# Patient Record
Sex: Male | Born: 1970 | Race: White | Hispanic: No | Marital: Single | Smoking: Former smoker
Health system: Southern US, Community
[De-identification: ages and names within clinical notes are randomized; demographics above are authoritative.]

## PROBLEM LIST (undated history)

## (undated) HISTORY — PX: BACK SURGERY: SHX140

---

## 1999-05-02 ENCOUNTER — Encounter: Payer: Self-pay | Admitting: Emergency Medicine

## 1999-05-02 ENCOUNTER — Emergency Department (HOSPITAL_COMMUNITY): Admission: EM | Admit: 1999-05-02 | Discharge: 1999-05-02 | Payer: Self-pay | Admitting: Emergency Medicine

## 1999-11-04 ENCOUNTER — Encounter: Payer: Self-pay | Admitting: Emergency Medicine

## 1999-11-04 ENCOUNTER — Emergency Department (HOSPITAL_COMMUNITY): Admission: EM | Admit: 1999-11-04 | Discharge: 1999-11-04 | Payer: Self-pay | Admitting: Emergency Medicine

## 2000-10-27 ENCOUNTER — Encounter: Payer: Self-pay | Admitting: Emergency Medicine

## 2000-10-28 ENCOUNTER — Inpatient Hospital Stay (HOSPITAL_COMMUNITY): Admission: EM | Admit: 2000-10-28 | Discharge: 2000-10-29 | Payer: Self-pay | Admitting: Emergency Medicine

## 2001-01-19 ENCOUNTER — Ambulatory Visit (HOSPITAL_COMMUNITY): Admission: RE | Admit: 2001-01-19 | Discharge: 2001-01-19 | Payer: Self-pay | Admitting: Internal Medicine

## 2001-01-19 ENCOUNTER — Encounter: Payer: Self-pay | Admitting: Internal Medicine

## 2001-02-06 ENCOUNTER — Encounter: Admission: RE | Admit: 2001-02-06 | Discharge: 2001-05-07 | Payer: Self-pay

## 2001-07-22 ENCOUNTER — Emergency Department (HOSPITAL_COMMUNITY): Admission: EM | Admit: 2001-07-22 | Discharge: 2001-07-22 | Payer: Self-pay | Admitting: Internal Medicine

## 2001-09-11 ENCOUNTER — Emergency Department (HOSPITAL_COMMUNITY): Admission: EM | Admit: 2001-09-11 | Discharge: 2001-09-11 | Payer: Self-pay | Admitting: Emergency Medicine

## 2001-09-11 ENCOUNTER — Encounter: Payer: Self-pay | Admitting: Emergency Medicine

## 2003-01-27 ENCOUNTER — Emergency Department (HOSPITAL_COMMUNITY): Admission: EM | Admit: 2003-01-27 | Discharge: 2003-01-27 | Payer: Self-pay | Admitting: Emergency Medicine

## 2003-01-31 ENCOUNTER — Inpatient Hospital Stay (HOSPITAL_COMMUNITY): Admission: EM | Admit: 2003-01-31 | Discharge: 2003-02-01 | Payer: Self-pay | Admitting: Emergency Medicine

## 2003-06-18 ENCOUNTER — Emergency Department (HOSPITAL_COMMUNITY): Admission: EM | Admit: 2003-06-18 | Discharge: 2003-06-18 | Payer: Self-pay | Admitting: Emergency Medicine

## 2003-09-19 ENCOUNTER — Inpatient Hospital Stay (HOSPITAL_COMMUNITY): Admission: EM | Admit: 2003-09-19 | Discharge: 2003-09-21 | Payer: Self-pay | Admitting: Emergency Medicine

## 2003-09-26 ENCOUNTER — Ambulatory Visit (HOSPITAL_COMMUNITY): Admission: RE | Admit: 2003-09-26 | Discharge: 2003-09-26 | Payer: Self-pay | Admitting: Internal Medicine

## 2004-02-25 ENCOUNTER — Emergency Department (HOSPITAL_COMMUNITY): Admission: EM | Admit: 2004-02-25 | Discharge: 2004-02-25 | Payer: Self-pay | Admitting: Emergency Medicine

## 2004-10-31 ENCOUNTER — Ambulatory Visit: Payer: Self-pay | Admitting: Family Medicine

## 2004-11-21 ENCOUNTER — Ambulatory Visit: Payer: Self-pay | Admitting: Family Medicine

## 2005-01-31 ENCOUNTER — Ambulatory Visit: Payer: Self-pay | Admitting: Family Medicine

## 2005-02-06 ENCOUNTER — Ambulatory Visit (HOSPITAL_COMMUNITY): Admission: RE | Admit: 2005-02-06 | Discharge: 2005-02-06 | Payer: Self-pay | Admitting: Family Medicine

## 2005-03-08 ENCOUNTER — Emergency Department (HOSPITAL_COMMUNITY): Admission: EM | Admit: 2005-03-08 | Discharge: 2005-03-08 | Payer: Self-pay | Admitting: Emergency Medicine

## 2005-03-15 ENCOUNTER — Emergency Department (HOSPITAL_COMMUNITY): Admission: EM | Admit: 2005-03-15 | Discharge: 2005-03-15 | Payer: Self-pay | Admitting: Emergency Medicine

## 2005-04-05 ENCOUNTER — Observation Stay (HOSPITAL_COMMUNITY): Admission: EM | Admit: 2005-04-05 | Discharge: 2005-04-08 | Payer: Self-pay | Admitting: Emergency Medicine

## 2005-04-16 ENCOUNTER — Ambulatory Visit: Payer: Self-pay | Admitting: Family Medicine

## 2005-04-18 ENCOUNTER — Ambulatory Visit (HOSPITAL_COMMUNITY): Admission: RE | Admit: 2005-04-18 | Discharge: 2005-04-18 | Payer: Self-pay | Admitting: Family Medicine

## 2005-05-01 ENCOUNTER — Ambulatory Visit: Payer: Self-pay | Admitting: Internal Medicine

## 2005-06-13 ENCOUNTER — Ambulatory Visit: Payer: Self-pay | Admitting: Internal Medicine

## 2006-06-25 ENCOUNTER — Ambulatory Visit: Payer: Self-pay | Admitting: Family Medicine

## 2007-02-19 ENCOUNTER — Encounter
Admission: RE | Admit: 2007-02-19 | Discharge: 2007-05-20 | Payer: Self-pay | Admitting: Physical Medicine and Rehabilitation

## 2007-06-04 ENCOUNTER — Encounter: Payer: Self-pay | Admitting: Family Medicine

## 2007-08-11 DIAGNOSIS — R569 Unspecified convulsions: Secondary | ICD-10-CM

## 2007-08-11 DIAGNOSIS — F1920 Other psychoactive substance dependence, uncomplicated: Secondary | ICD-10-CM | POA: Insufficient documentation

## 2007-08-11 DIAGNOSIS — K219 Gastro-esophageal reflux disease without esophagitis: Secondary | ICD-10-CM | POA: Insufficient documentation

## 2007-11-24 ENCOUNTER — Emergency Department (HOSPITAL_COMMUNITY): Admission: EM | Admit: 2007-11-24 | Discharge: 2007-11-24 | Payer: Self-pay | Admitting: Emergency Medicine

## 2007-12-19 ENCOUNTER — Emergency Department (HOSPITAL_COMMUNITY): Admission: EM | Admit: 2007-12-19 | Discharge: 2007-12-19 | Payer: Self-pay | Admitting: Emergency Medicine

## 2008-01-07 ENCOUNTER — Emergency Department (HOSPITAL_COMMUNITY): Admission: EM | Admit: 2008-01-07 | Discharge: 2008-01-07 | Payer: Self-pay | Admitting: Emergency Medicine

## 2009-05-18 ENCOUNTER — Inpatient Hospital Stay (HOSPITAL_COMMUNITY): Admission: EM | Admit: 2009-05-18 | Discharge: 2009-05-19 | Payer: Self-pay | Admitting: Emergency Medicine

## 2010-07-03 NOTE — Letter (Signed)
Summary: rpc chart  rpc chart   Imported By: Curtis Sites 01/11/2010 13:31:21  _____________________________________________________________________  External Attachment:    Type:   Image     Comment:   External Document

## 2010-09-03 LAB — BASIC METABOLIC PANEL
BUN: 12 mg/dL (ref 6–23)
BUN: 9 mg/dL (ref 6–23)
CO2: 27 mEq/L (ref 19–32)
CO2: 28 mEq/L (ref 19–32)
Calcium: 8.5 mg/dL (ref 8.4–10.5)
Calcium: 8.6 mg/dL (ref 8.4–10.5)
Chloride: 104 mEq/L (ref 96–112)
Chloride: 110 mEq/L (ref 96–112)
Creatinine, Ser: 0.99 mg/dL (ref 0.4–1.5)
Creatinine, Ser: 1.17 mg/dL (ref 0.4–1.5)
GFR calc Af Amer: >60 mL/min (ref >60)
GFR calc Af Amer: >60 mL/min (ref >60)
GFR calc non Af Amer: >60 mL/min (ref >60)
GFR calc non Af Amer: >60 mL/min (ref >60)
Glucose, Bld: 102 mg/dL — ABNORMAL HIGH (ref 70–99)
Glucose, Bld: 92 mg/dL (ref 70–99)
Potassium: 3.9 mEq/L (ref 3.5–5.1)
Potassium: 4 mEq/L (ref 3.5–5.1)
Sodium: 140 mEq/L (ref 135–145)
Sodium: 142 mEq/L (ref 135–145)

## 2010-09-03 LAB — URINALYSIS, ROUTINE W REFLEX MICROSCOPIC
Bilirubin Urine: NEGATIVE
Glucose, UA: NEGATIVE mg/dL
Leukocytes, UA: NEGATIVE
Nitrite: NEGATIVE
Protein, ur: 100 mg/dL — AB
Specific Gravity, Urine: 1.01 (ref 1.005–1.030)
Urobilinogen, UA: 0.2 mg/dL (ref 0.0–1.0)
pH: >9 — ABNORMAL HIGH (ref 5.0–8.0)

## 2010-09-03 LAB — URINE MICROSCOPIC-ADD ON
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
## 2010-10-19 NOTE — Discharge Summary (Signed)
Alexander Liu, Alexander Liu                           ACCOUNT NO.:  [id number]   MEDICAL RECORD NO.:  [id number]                   PATIENT TYPE:  INP   LOCATION:  A217                                 FACILITY:  [facility]   PHYSICIAN:  Hanley Hays. Dechurch, M.D.           DATE OF BIRTH:  May 23, 1971   DATE OF ADMISSION:  09/19/2003  DATE OF DISCHARGE:  09/21/2003                                 DISCHARGE SUMMARY   DIAGNOSES:  1. Seizure disorder.  2. History of OxyContin addiction, now on methadone maintenance.  3. Constipation.  4. Reflux.   DISPOSITION:  The patient is discharged to home.  Follow up with Dr.  Gerilyn Pilgrim in two weeks.  Outpatient MRI prior to follow up.  Follow up with  Dr.  Lodema Hong as schedule.   HOSPITAL MEDICATIONS:  1. Trileptal 300 mg b.i.d. for one week then increase to 600 b.i.d.  2. MiraLax 17 g x2 daily.  May increase or decrease as needed.  3. Valium 10 mg b.i.d. p.r.n. spasm and anxiety.  4. Methadone maintenance, currently on 75 mg daily.   CONDITION ON DISCHARGE:  Improved.   HOSPITAL COURSE:  A 40 year old Caucasian male who has been on methadone  maintenance since September 2004 after being treated for OxyContin  addiction.  He has actually been doing reasonably well.  Today, on the day  of discharge, the patient had what was described as a generalized tonic  clonic seizure.  He was seen in consultation by Dr. Gerilyn Pilgrim.  EEG revealed  abnormal discharges revealed left temporal epileptiform discharges.  It was  felt that he should be treated with antiepileptic medications, though he did  not officially fit the criteria as he only had one seizure.  The patient  tolerated his initial dose of Trileptal without difficulty.  This  hospitalization was unremarkable.  He did have problems with constipation related to his chronic narcotic use.  Bowel regimen and dietary consultation was obtained.  He was stable at the  time of discharge and discharged to home in stable  condition with follow up  as noted above.     ___________________________________________                                         Hanley Hays. Josefine Class, M.D.   FED/MEDQ  D:  09/21/2003  T:  09/22/2003  Job:  [id number]   cc:   Milus Mallick. Lodema Hong, M.D.  [address]  [place], [place] [address]  Fax: [phone]   Kofi A. Gerilyn Pilgrim, M.D.  [address] [place]  [place]  [place] [address]  Fax: [phone]

## 2010-10-19 NOTE — Discharge Summary (Signed)
[name], [name]                           ACCOUNT NO.:  [id number]   MEDICAL RECORD NO.:  [id number]                   PATIENT TYPE:  INP   LOCATION:  3020                                 FACILITY:  [facility]   PHYSICIAN:  C. Ulyess Mort, M.D.             DATE OF BIRTH:  05/12/1971   DATE OF ADMISSION:  01/30/2003  DATE OF DISCHARGE:  02/01/2003                                 DISCHARGE SUMMARY   DISCHARGE DIAGNOSIS:  Opioid withdrawal.   DISCHARGE MEDICATIONS:  None.   FOLLOWUP:  The patient is to meet with [facility] on February 02, 2003 at 7 a.m. to become enrolled in their methadone clinic.   HISTORY AND PHYSICAL:  The patient is a 40 year old white male who presents  after quitting OxyContin three days ago.  He reports he was taking six to  seven 80 mg pills of OxyContin for many years.  He states he would rub off  the extended-release codeine, crush the medication and snort it through his  nose.  [facility] and stopped  his drug abuse cold.  He began with symptoms of a withdrawal on the morning  of January 30, 2003.  He reports shivers, nausea, vomiting, _________  erection and diarrhea.  It appears a doctor is not present on the weekends  at [facility] and the patient was referred to the [facility].  On  admission, he was asking for methadone and stating that he would like to  join the methadone program, as he cannot handle the symptoms that are  occurring from his withdrawal.   ALLERGIES:  No known drug allergies.   SOCIAL HISTORY:  He denies tobacco, alcohol and illicit drugs, other than  oxycodone.  He is divorced and currently unemployed.  He has one son who is  39 years old who resides with him; his son is currently staying with his  great aunt.   PHYSICAL EXAMINATION:  VITAL SIGNS:  Temperature 99.2, blood pressure  110/69, pulse 61, respirations of 19, O2 saturation 98% on room air.  GENERAL:   Shaking, anxious, vomiting.  EYES:  Mild dilation of pupils.  RESPIRATORY:  Clear to auscultation bilaterally.  CARDIOVASCULAR:  Regular rate and rhythm with no murmurs, rubs, or gallops.  GI:  Soft, nondistended, diffuse tenderness.  EXTREMITIES:  No cyanosis, clubbing or edema.  NEUROLOGICAL:  No focal deficits.   ADMISSION LABORATORIES:  WBC 6.7, hemoglobin 14.2, hematocrit 41.5,  platelets 306,000.  Sodium 141, potassium 3.2, chloride 109, bicarb 23, BUN  14, creatinine 1.2, glucose 137.  Alcohol level less than 5.  Urinary drug  screen:  Positive benzodiazepines, positive opiates and positive cannabis.   HOSPITAL COURSE:  PROBLEM #1 - OPIATE WITHDRAWAL:  The patient's symptoms  were managed medically.  Zofran and Compazine were used for nausea,  clonidine patch was placed for  increasing sympathetic symptoms, trazodone  and Ativan were used for anxiety relief and bismuth for diarrhea.  The  patient received one 40 mg dose of methadone on January 31, 2003; he received  another 40 mg dose of methadone on February 01, 2003 prior to discharge.  The  patient is to follow up with the methadone clinic tomorrow morning at 7 a.m.  to enroll in their program.  The patient has also been informed about  Narcotics Anonymous.   PROBLEM #2 - HYPOKALEMIA:  The patient's hypokalemia was likely secondary to  his extensive vomiting.  Potassium was repleted.      Alexander Harness, MD                    Alexander Liu, M.D.    Alexander Liu  D:  02/01/2003  T:  02/02/2003  Job:  [id number]

## 2010-10-19 NOTE — Procedures (Signed)
[facility]  Patient:    Alexander Liu, Alexander Liu Visit Number: [id number] MRN: [id number]          Service Type: PMG Location: [facility] Attending Physician:  Sondra Come Dictated by:   Dr. Resa Miner. Date: 02/12/01 Admit Date:  02/06/2001   CC:         Alexander Liu, M.D. at [facility] [address] [place], [place] [address]             [address]   Procedure Report  HISTORY OF PRESENT ILLNESS:  Alexander Liu returns to clinic today after his initial evaluation on 02/09/01, for a lumbar epidural steroid injection.  Alexander Liu is status post L5-S1 diskectomy in October 2001.  He has a L4-5 herniated disk, and complains of bilateral lower extremity radicular symptoms. Health and history form and 14 point review of systems was reviewed.  There was no significant change per patient since his initial visit three days ago. The patient continues to take OxyContin 40 mg two per day, and Oxy IR 5 mg for breakthrough pain.  PHYSICAL EXAMINATION:  Unchanged from three days ago.  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  PLAN:  Lumbar epidural steroid injection.  The procedure was explained to the patient, and informed consent was obtained.  DESCRIPTION OF PROCEDURE:  The patient was brought back to the fluoroscopy suite and placed on the table in prone position.  Skin was prepped in usual sterile fashion.  Skin and subcutaneous tissue was anesthetized with 3 cc of MPF or preservative free 1% lidocaine.  Under direct fluoroscopic guidance, an 18 gauge 3.5 inch Tuohy needle was advanced into the paramedian at L5-S1 epidural space with loss of resistance technique.  No CSF, heme, or paresthesia was noted.  This was injected with 1 cc of preservative free Depo-Medrol 80 mg/cc plus 1 cc  of normal saline without complication.  The patient tolerated the procedure well.  Discharge instructions given.  FOLLOWUP:  The patient is to return to clinic in one week for re-evaluation and possible second lumbar epidural steroid injection.  DISCHARGE MEDICATIONS:  Continue current medications.  A long-term goal would be to wean from narcotic analgesia.  Await appointment with Dr. Bernette Redbird, spine surgeon, for possible surgical intervention.  The patient was educated on the above findings and recommendations, and understands.  There were no barriers to communication. Dictated by:   Dr. Andrey Campanile Attending Physician:  Sondra Come DD:  02/12/01 TD:  02/12/01 Job: [phone] [id number]

## 2010-10-19 NOTE — H&P (Signed)
NAMEMAHARI, STRAHM NO.:  [id number]   MEDICAL RECORD NO.:  [id number]          PATIENT TYPE:  EMS   LOCATION:  ED                            FACILITY:  [facility]   PHYSICIAN:  Osvaldo Shipper, MD     DATE OF BIRTH:  12/10/1970   DATE OF ADMISSION:  04/05/2005  DATE OF DISCHARGE:  LH                                HISTORY & PHYSICAL   PRIMARY CARE DOCTOR:  Dr. Syliva Overman.   NEUROLOGIST:  Dr. Gerilyn Pilgrim.   PSYCHIATRIST:  Dr. Redmond Baseman.   ADMITTING DIAGNOSES:  1.  Acute gastritis, likely viral.  2.  History of seizure disorder.  3.  History of gastroesophageal reflux disease.  4.  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   CHIEF COMPLAINT:  Nausea and vomiting for the past two days.   HISTORY OF PRESENT ILLNESS:  The patient is a 40 year old white male with  the past history of seizure disorder, GERD, and history of drug dependency  on treatment who was well until yesterday morning when he started  experiencing nausea followed by emesis.  He also gave history of fever which  started along with his other symptoms.  The patient does not give history of  any abdominal pain, but he does mention that he experienced some burning  sensation in his upper abdomen.  The patient denied eating out anywhere  recently.  He denies any sick contact.  He only mentions that he has a new  kitten which he acquired about a month and a half ago.   The emesis initially was of food then clear followed by bilious and dark  brown in appearance.  At no point did he have any fresh blood in it.   The patient never experienced any abdominal pain.  He just experienced some  burning today because of his persistent vomiting and nausea.  There is  history of fever.  He never recorded his temperature at home; however, he  said his temperature here has been as high as 103.  He did experience some  chills at home.  He did have two bowel movements yesterday which were  not  loose, and he had one formed bowel movement this morning which was brownish  in color with no blood.  Otherwise, he does not give any history of  diarrhea.   The patient denies any urinary discomfort or any other symptoms at this  time.   MEDICATIONS AT HOME:  1.  Methadone 40 mg t.i.d.  2.  Xanax 1 mg t.i.d.  3.  Prevacid unknown dose twice daily.  4.  Trileptal 600 mg b.i.d.   No over the counter drug use.   ALLERGIES:  No known drug allergies.   PAST MEDICAL HISTORY:  1.  Significant for GERD diagnosed three to four years ago.  2.  Seizure disorders diagnosed two years ago.  No seizure for more than a      year currently.  3.  History of anxiety disorder.  4.  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  5.  History of back surgery x 2 in the past.  6.  There is no history of EGD or colonoscopy in this patient.   SOCIAL HISTORY:  The patient lives in [place] with his 40-year-old son and  a great aunt.  He is a single parent.  He is currently unemployed.  Quit  smoking seven to eight years ago.  No alcohol intake whatsoever.  He does  smoke marijuana occasionally.  He used to do cocaine in the past.  Nothing  recently.  No history of any IV drug use.   FAMILY HISTORY:  Mother has diabetes and acid reflux disease.  One of his  brothers has panic disorder.  Family history also positive for bone cancer  and another unknown cancer.  History of heart disease in a grandmother.   REVIEW OF SYSTEMS:  A 10-point review of systems was done which was just  remarkable for patient feeling weak and with slight dizziness currently.   PHYSICAL EXAMINATION:  VITAL SIGNS:  On presentation, his temperature was  102, blood pressure 107/61, heart rate 105, respiratory rate 16, saturating  99% on room air.  Temperature went as high as 103.4.  Last recorded  temperature is 100.3.  GENERAL:  This is a well-developed, well-nourished young male in slight  discomfort  because of his nausea and vomiting.  HEENT:  There is no pallor, no icterus.  Oral mucosa is slightly on the dry  side.  No oral lesions are seen.  NECK:  Soft and supple.  No thyromegaly is appreciated.  CARDIOVASCULAR:  S1 and S2 are normal.  Regular.  No murmurs appreciated.  LUNGS:  Clear to auscultation bilaterally.  ABDOMEN:  Remarkably soft, nontender, and nondistended.  Bowel sounds are  sluggish.  No mass or organomegaly is present.  EXTREMITIES:  Without edema.  No cyanosis is present.  Scratch marks are  noted on both hands which is related to his pet.  PSYCHIATRIC:  The patient is alert and oriented x 3.   LABORATORY DATA:  His white count is 11.2.  Full differential is not  available at this time.  Hemoglobin is 14.1, platelet count 165.   Sodium 128, potassium 3.6, chloride 91, bicarb 23, glucose 140, BUN 14,  creatinine 1.1, total bilirubin 0.6, alk phos 97, AST is 89, ALT 123,  albumin 3.8, calcium 8.6.   Lipase normal at 14.   UA was just positive for some protein.  Otherwise, unremarkable.   IMAGING STUDIES:  The patient had a CAT scan of his abdomen and pelvis which  showed a thickened terminal ileum with no signs for inflammation.  Otherwise, no other abnormality has been reported by the radiologist at this  time.   IMPRESSION:  This is a 40 year old white male with history of seizure  disorder, drug dependency and GERD who presents with nausea and vomiting for  the past two days.  He is also febrile with temperatures up to 103.  The  patient appears to possibly have acute gastritis and possibly a viral  syndrome which could be causing all of his symptoms at this time.  The  patient does have elevated LFTs which could be related to Trileptal as this  medication can cause elevated transaminases.  Hepatitis as the cause for his  current symptoms is very unlikely.  He does not have any other focal symptoms to further characterize his fever.   PLAN:  1.  We will  observe the patient in the hospital and give him  IV fluids and      antiemetics and also give him PPI through the IV route.  With the      conservative management, his symptoms improve.  We will keep him n.p.o.      except medication at this time; and once his symptoms improve, we can      slowly start feeding him.   1.  Regarding his elevated transaminases, probably related to his Trileptal.      However, we will check hepatitis profile in this patient.  CT of the      abdomen and pelvis did not report any abnormality in the liver as such.   1.  Fever likely related to the viral syndrome.  We will, however, check      blood cultures and do a chest x-ray if the patient continues to have      fever.  At this time, I am refraining from starting any antibiotics as      we do not have any clear indication for the source of fever at this      time.   1.  For his seizure disorder, will continue his Trileptal at this time.  We      will also continue his methadone and Xanax.  Will check a urine drug      screen.   1.  Regarding the thickened terminal ileum, since there is no inflammation      in that area, it is probably just an incidental finding which is also      consistent with a very benign physical examination.  He might require      outpatient referral to gastroenterology.   Further management decision will be based on results of initial testing and  the patient's response to treatment.      Osvaldo Shipper, MD  Electronically Signed     GK/MEDQ  D:  04/05/2005  T:  04/05/2005  Job:  [id number]   cc:   Milus Mallick. Lodema Hong, M.D.  Fax: [phone]

## 2010-10-19 NOTE — Discharge Summary (Signed)
NAMEMARSHON, Alexander NO.:  [id number]   MEDICAL RECORD NO.:  [id number]          PATIENT TYPE:  OBV   LOCATION:  A325                          FACILITY:  [facility]   PHYSICIAN:  Osvaldo Shipper, MD     DATE OF BIRTH:  1970/10/04   DATE OF ADMISSION:  04/05/2005  DATE OF DISCHARGE:  11/06/2006LH                                 DISCHARGE SUMMARY   DISCHARGE DIAGNOSES:  1.  Acute viral gastritis, currently resolved.  2.  Thrombocytopenia, stable, likely secondary to antiseizure medication.  3.  Seizure disorder, stable.  4.  Drug dependency, on methadone maintenance.   Please review the H&P dictated at the time of admission for details  regarding the patient's presenting illness.   BRIEF HOSPITAL COURSE:  1.  Acute viral gastritis.  This is a 40 year old young Caucasian male with      a past medical history of GERD, seizure disorder and drug dependency on      methadone maintenance, who presented to the ED with a two-day history of      severe nausea, vomiting, acid reflux and high fevers.  The patient was      thought to have acute gastritis probably related to a viral syndrome.      He was started on IV fluids and given PPI through the IV route and given      Tylenol for fever.  Blood cultures, urine cultures and chest x-ray were      done to evaluate the fever further, but no focal source of infection was      found.  The patient improved subsequently over the following two days.      He is currently not having high temperatures.  His last fever was last      night, which was about 100.4.  Subsequently, he has been afebrile.      Symptomatically, he has improved significantly, and he is able to      tolerate p.o. intake with no difficulties whatsoever.  Considering all      of the above, the patient is considered stable for discharge.  The      patient did have a CAT scan of his abdomen and pelvis done by the ED at      the time of presentation, and he was found to  have a possible thickened      ileal wall, but no active inflammation was noted.  The patient's abdomen      is completely benign; however, I believe he might benefit from an      outpatient GI referral.   1.  Elevated LFTs.  The patient was also found to have elevated AST and ALT.      His alk phos and his bilirubin were normal.  Trileptal is known to cause      elevated liver enzymes as well.  His enzyme levels did come down, though      not completely to normal limits.  We did send a hepatitis profile which      is pending at this time, which  will need to be followed up.  I am going      to have a repeat LFT check this Friday to ensure that his levels are      stable or have returned to normal.   1.  Thrombocytopenia.  The patient's presentation platelet count was about      165.  Subsequent levels were running in the 110s-120s range.  Again,      there is no clear-cut reason for his thrombocytopenia apart from the      Trileptal.  The patient does not have any overt bleeding.  He does not      consume alcohol.  A hepatitis profile, as mentioned above, is pending at      this time.  Once again, we will recheck his platelet count this Friday.      If the patient's counts are dropping or are still staying low, he might      benefit from referral to a hematologist.   1.  His other medical problems, including seizure disorder and drug      dependency, remained stable.   On the day of discharge, his vital signs remain stable.  The patient is  asymptomatic, and he is considered okay for DC.   DISCHARGE MEDICATIONS:  1.  Prevacid 30 mg p.o. twice daily.  2.  Tylenol 650 mg p.r.n. for fever.   Otherwise, the patient may resume all of his other outpatient medications as  before, which include methadone, Xanax and Trileptal.   FOLLOWUP:  1.  With Dr. Syliva Overman in one week.  2.  CBC and a CMP to be done on November 10th to follow up on      thrombocytopenia and elevated liver  enzymes.  3.  Imaging studies:  CT of the abdomen and pelvis as discussed above.  4.  Other followup includes outpatient consultation with Dr. Karilyn Cota or Dr.      Jena Gauss for a thickened ileal wall seen on CAT scan.      Osvaldo Shipper, MD  Electronically Signed     GK/MEDQ  D:  04/08/2005  T:  04/08/2005  Job:  [id number]   cc:   Milus Mallick. Lodema Hong, M.D.  Fax: [phone]   Lionel December, M.D.  [address]  [place]  [place] [address]

## 2010-10-19 NOTE — Procedures (Signed)
NAME:  BREVEN, GUIDROZ                           ACCOUNT NO.:  [id number]   MEDICAL RECORD NO.:  [id number]                   PATIENT TYPE:  INP   LOCATION:  A217                                 FACILITY:  [facility]   PHYSICIAN:  Kofi A. Gerilyn Pilgrim, M.D.              DATE OF BIRTH:  Apr 03, 1971   DATE OF PROCEDURE:  DATE OF DISCHARGE:                                EEG INTERPRETATION   The patient is a 40 year old who has new onset seizures.   ANALYSIS:  This 16-channel recording is conducted for approximately 20  minutes. The quality of the recording is acceptable, although there are some  high amplitude sway/sweat activity noted. There is also continuous lead  popping noted in the F4 lead. In any case, there is a __________ 12 to 11  hertz that is well formed which attenuates with eye opening. There is beta  activity seen in the frontal areas. Awake and drowsy activities are seen.  Photic stimulation and hyperventilation does not elicit any abnormal  responses. There is rare sharp wave activity seen in the left temporal  region which phase reverses at T3 in which has a field.  Two to three of  these discharges are seen.   IMPRESSION:  This is an abnormal recording of the awake and drowsy state due  to rare epileptiform discharges seen in the left temporal lobe.      ___________________________________________                                            Darleen Crocker A. Gerilyn Pilgrim, M.D.   KAD/MEDQ  D:  09/20/2003  T:  09/20/2003  Job:  [id number]

## 2010-10-19 NOTE — Consult Note (Signed)
[facility]  Patient:    Alexander Liu, Alexander Liu Visit Number: [id number] MRN: [id number]          Service Type: PMG Location: [facility] Attending Physician:  Sondra Come Dictated by:   Sondra Come, D.O. Proc. Date: 02/09/01 Admit Date:  02/06/2001   CC:         Angelique Blonder _________, M.D.   Consultation Report  REFERRING Garnet Chatmon:  Dr. Angelique Blonder ______ , [facility], [address], [place], [place]  [address]  CHIEF COMPLAINT:  Low back pain with lower extremity pain.  HISTORY OF PRESENT ILLNESS:  Alexander Liu is a 40 year old right-hand-dominant male who was kindly referred by Dr. ______.  Patient presents accompanied by his mother.  Patient states that he has low back pain radiating into his lower extremities with associated numbness in his right buttock, left anterior thigh, anterior leg and great toe.  He is status post L5-S1 diskectomy, October of 2001, by Dr. Winn Jock. [place].  Postoperatively, patient was supposed to have physical therapy, however, he states that his wife left him to care for their 18-month-old son and patient could not find the time to attend therapy.  Nevertheless, patient states that he does not believe he had adequate healing and continues to have back pain.  His back pain has worsened significantly and a repeat MRI of the lumbar spine was done revealing a recurrent L5-S1 disk herniation central to left, paracentrally.  There is also a small L4-5 disk protrusion.  Dr. ______ has referred Mr. Taher back to Dr. Gerrit Heck.  He has an appointment September 17th or 19th.  Patient has not had any interventional spinal injections recently.  He states that he had a lumbar epidural steroid injection prior to his surgery which gave him some mild relief.  He has primarily been treated with pain medication including OxyContin 40 mg b.i.d., but has required three-times-a-day dosing over the past few days.  Patient  states that he has approximately 22 days left on his current prescription.  His pain is a 8/10 on a subjective scale and characterized as constant, sharp, burning and stabbing, made worse with bending, sitting and working, improved with medications.  Sleep is poor secondary to pain.  Function and quality-of-life indices have declined. Patient is not currently working.  He was previously working in [employer]. Health and history form and 14-point review of systems were reviewed.  Patient denies bowel and bladder dysfunction.  He admits to constipation and heartburn as well as ringing and buzzing in the ears, excessive worry and depression and weakness in his left lower extremity.  PAST MEDICAL HISTORY:  Denies.  PAST SURGICAL HISTORY:  L5-S1 diskectomy, October 2001.  FAMILY HISTORY:  Heart disease, lung disease, cancer, diabetes and high blood pressure.  SOCIAL HISTORY:  Denies smoking.  Occasional alcohol use.  He is currently separated from his wife.  He is not working for the past several months.  ALLERGIES:  No known drug allergies.  MEDICATIONS: 1. Paxil 20 mg daily. 2. OxyContin 40 mg b.i.d. 3. Valium 5 mg daily as needed, patient is rarely taking this now. 4. Vioxx as needed without any relief.  PHYSICAL EXAMINATION:  GENERAL:  Physical examination reveals a healthy male in no acute distress. Mood is depressed, affect is flat.  VITAL SIGNS:  Blood pressure 137/69, pulse 89, respirations 16, pulse oximetry 95%.  NEUROMUSCULAR:  Examination of the patients back reveals a midline scar in the lumbosacral region.  There is significant tenderness to palpation in bilateral lumbar paraspinals with exaggerated response.  Range of motion reveals limited flexion and extension secondary to pain.  Manual muscle testing is 5/5, bilateral lower extremities, with the exception of 4/5, left hip flexor and left ankle dorsiflexors.  Sensory exam reveals decreased light touch to the  left anterior thigh, and diffusely in the left leg and foot. Muscle stretch reflexes are 2+/4, bilateral patellar and medial hamstrings, 0/4, bilateral Achilles.  Straight leg raise is positive on the right at 50 degrees.  FABER test is negative bilaterally.  There are tight hamstring and hip flexor muscles noted bilaterally.  PERIPHERAL VASCULAR:  Pulses are equal distally, bilateral lower extremities, without heat, erythema or edema.  IMAGING STUDY:  MRI report dated January 18, 2001 reveals a large recurrent central-and-left paracentral disk herniation at L5-S1 with mass effect on the thecal sac and on the left S1 nerve root.  There is also a shallow disk protrusion at L4-5.  IMPRESSION: 1. 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  Patient seems to have mild    weakness in the left L4 distribution on the left with L4 distribution    radicular symptoms.  His exam was also noteworthy for positive straight leg    raise which would be consistent with S1 irritation. 2. Depressive disorder.  PLAN: 1. Lumbar epidural steroid injection.  Procedure was discussed at length with    patient, who will be brought back to clinic for a trial of lumbar epidural    steroid injections with goal of decreasing pain and decreasing need for    narcotic analgesia. 2. Prescription for OxyIR 5 mg 1 p.o. q.4h. p.r.n. breakthrough pain,    #60 without refills. 3. Physical therapy consult for range of motion, lumbar stabilization    exercises in a static-to-dynamic fashion with lower extremity stretching    and body mechanics as well as a TENS trial, two to three times per week    x 4 weeks. 4. Await followup with spine surgeon, Dr. Gerrit Heck.  Patient was educated in the above findings and recommendations and understands.  There were no barriers to communication. Dictated by:   Sondra Come, D.O. Attending Physician:  Sondra Come  DD:  02/09/01 TD:  02/10/01 Job: [id number]

## 2011-02-28 LAB — POCT I-STAT, CHEM 8
BUN: 11
Calcium, Ion: 1.16
Chloride: 102
Creatinine, Ser: 1.1
Glucose, Bld: 94
HCT: 39
Hemoglobin: 13.3
Potassium: 3.6
Sodium: 139
TCO2: 26

## 2011-03-01 LAB — URINALYSIS, ROUTINE W REFLEX MICROSCOPIC
Bilirubin Urine: NEGATIVE
Glucose, UA: NEGATIVE
Ketones, ur: NEGATIVE
Nitrite: NEGATIVE
Specific Gravity, Urine: >1.03 — ABNORMAL HIGH
Urobilinogen, UA: 0.2
pH: 5.5

## 2011-03-01 LAB — URINE MICROSCOPIC-ADD ON

## 2011-03-01 LAB — URINE CULTURE: Colony Count: 3000

## 2012-07-29 ENCOUNTER — Encounter (HOSPITAL_COMMUNITY): Payer: Self-pay | Admitting: Emergency Medicine

## 2012-07-29 ENCOUNTER — Emergency Department (HOSPITAL_COMMUNITY)
Admission: EM | Admit: 2012-07-29 | Discharge: 2012-07-29 | Disposition: A | Discharge status: Home / Self Care | Payer: Medicaid Other | Attending: Emergency Medicine | Admitting: Emergency Medicine

## 2012-07-29 DIAGNOSIS — H9209 Otalgia, unspecified ear: Secondary | ICD-10-CM | POA: Insufficient documentation

## 2012-07-29 DIAGNOSIS — R42 Dizziness and giddiness: Secondary | ICD-10-CM | POA: Insufficient documentation

## 2012-07-29 DIAGNOSIS — R12 Heartburn: Secondary | ICD-10-CM | POA: Insufficient documentation

## 2012-07-29 DIAGNOSIS — R450 Nervousness: Secondary | ICD-10-CM | POA: Insufficient documentation

## 2012-07-29 DIAGNOSIS — R52 Pain, unspecified: Secondary | ICD-10-CM | POA: Insufficient documentation

## 2012-07-29 DIAGNOSIS — R630 Anorexia: Secondary | ICD-10-CM | POA: Insufficient documentation

## 2012-07-29 DIAGNOSIS — Z79899 Other long term (current) drug therapy: Secondary | ICD-10-CM | POA: Insufficient documentation

## 2012-07-29 DIAGNOSIS — R509 Fever, unspecified: Secondary | ICD-10-CM | POA: Insufficient documentation

## 2012-07-29 DIAGNOSIS — Z87891 Personal history of nicotine dependence: Secondary | ICD-10-CM | POA: Insufficient documentation

## 2012-07-29 DIAGNOSIS — R51 Headache: Secondary | ICD-10-CM | POA: Insufficient documentation

## 2012-07-29 DIAGNOSIS — R112 Nausea with vomiting, unspecified: Secondary | ICD-10-CM | POA: Insufficient documentation

## 2012-07-29 DIAGNOSIS — J020 Streptococcal pharyngitis: Secondary | ICD-10-CM | POA: Insufficient documentation

## 2012-07-29 DIAGNOSIS — F411 Generalized anxiety disorder: Secondary | ICD-10-CM | POA: Insufficient documentation

## 2012-07-29 LAB — COMPREHENSIVE METABOLIC PANEL WITH GFR
ALT: 88 U/L — ABNORMAL HIGH (ref 0–53)
AST: 56 U/L — ABNORMAL HIGH (ref 0–37)
Albumin: 4 g/dL (ref 3.5–5.2)
Alkaline Phosphatase: 127 U/L — ABNORMAL HIGH (ref 39–117)
BUN: 12 mg/dL (ref 6–23)
CO2: 28 meq/L (ref 19–32)
Calcium: 10 mg/dL (ref 8.4–10.5)
Chloride: 97 meq/L (ref 96–112)
Creatinine, Ser: 0.92 mg/dL (ref 0.50–1.35)
GFR calc Af Amer: >90 mL/min
GFR calc non Af Amer: >90 mL/min
Glucose, Bld: 144 mg/dL — ABNORMAL HIGH (ref 70–99)
Potassium: 3.6 meq/L (ref 3.5–5.1)
Sodium: 139 meq/L (ref 135–145)
Total Bilirubin: 0.3 mg/dL (ref 0.3–1.2)
Total Protein: 8.7 g/dL — ABNORMAL HIGH (ref 6.0–8.3)

## 2012-07-29 LAB — CBC WITH DIFFERENTIAL/PLATELET
Basophils Absolute: 0 10*3/uL (ref 0.0–0.1)
Basophils Relative: 0 % (ref 0–1)
Lymphocytes Relative: 4 % — ABNORMAL LOW (ref 12–46)
MCHC: 34.7 g/dL (ref 30.0–36.0)
Neutro Abs: 13.7 10*3/uL — ABNORMAL HIGH (ref 1.7–7.7)
Platelets: 228 10*3/uL (ref 150–400)
RDW: 13.1 % (ref 11.5–15.5)
WBC: 15 10*3/uL — ABNORMAL HIGH (ref 4.0–10.5)

## 2012-07-29 LAB — RAPID STREP SCREEN (MED CTR MEBANE ONLY): Streptococcus, Group A Screen (Direct): POSITIVE — AB

## 2012-07-29 MED ORDER — AMOXICILLIN 250 MG PO CAPS: 250.0000 mg | ORAL_CAPSULE | Freq: Three times a day (TID) | ORAL | Status: DC

## 2012-07-29 MED ORDER — DEXTROSE 5 % IV SOLN
1.0000 g | Freq: Once | INTRAVENOUS | Status: AC
  Administered 2012-07-29: 1 g via INTRAVENOUS
  Filled 2012-07-29: qty 10

## 2012-07-29 MED ORDER — SODIUM CHLORIDE 0.9 % IV SOLN
Freq: Once | INTRAVENOUS | Status: AC
  Administered 2012-07-29: 19:00:00 via INTRAVENOUS

## 2012-07-29 MED ORDER — RANITIDINE HCL 150 MG PO TABS: 150.0000 mg | ORAL_TABLET | Freq: Two times a day (BID) | ORAL | Status: DC

## 2012-07-29 MED ORDER — FAMOTIDINE IN NACL 20-0.9 MG/50ML-% IV SOLN
20.0000 mg | Freq: Once | INTRAVENOUS | Status: AC
  Administered 2012-07-29: 20 mg via INTRAVENOUS
  Filled 2012-07-29: qty 50

## 2012-07-29 MED ORDER — PROMETHAZINE HCL 12.5 MG PO TABS: 12.5000 mg | ORAL_TABLET | Freq: Four times a day (QID) | ORAL | Status: DC | PRN

## 2012-07-29 MED ORDER — ONDANSETRON HCL 4 MG/2ML IJ SOLN
4.0000 mg | Freq: Once | INTRAMUSCULAR | Status: AC
  Administered 2012-07-29: 4 mg via INTRAVENOUS
  Filled 2012-07-29: qty 2

## 2012-07-29 MED ORDER — PROMETHAZINE HCL 25 MG/ML IJ SOLN
12.5000 mg | Freq: Once | INTRAMUSCULAR | Status: AC
  Administered 2012-07-29: 12.5 mg via INTRAVENOUS
  Filled 2012-07-29: qty 1

## 2012-07-29 NOTE — ED Notes (Signed)
Pt c/o fever, cough, and vomiting since last night. Pt denies any blood in vomit. Pt also reports sore throat.

## 2012-07-29 NOTE — ED Provider Notes (Signed)
History     CSN: [id number]  Arrival date & time 07/29/12  1824   None     Chief Complaint  Patient presents with  . Sore Throat  . Emesis    (Consider location/radiation/quality/duration/timing/severity/associated sxs/prior Treatment)  HPI Alexander Liu is a 42 y.o. male who presents to the ED with sore throat. The sore throat started yesterday. He describes the sore throat as burning that hurts with swallowing. Rates his pain as 7/10.   Associated symptoms include vomiting, headache, ear pain, aching all over, heart burn. Temp 101 last night. The history was provided by the patient.  History reviewed. No pertinent past medical history.  Past Surgical History  Procedure Laterality Date  . Back surgery      History reviewed. No pertinent family history.  History  Substance Use Topics  . Smoking status: Former Games developer  . Smokeless tobacco: Not on file  . Alcohol Use: No      Review of Systems  Constitutional: Positive for fever, chills and appetite change. Negative for diaphoresis and fatigue.  HENT: Positive for sore throat. Negative for ear pain, congestion, facial swelling, neck pain, neck stiffness, dental problem, voice change and sinus pressure.   Eyes: Negative for photophobia, pain and discharge.  Respiratory: Negative for cough, chest tightness and wheezing.   Cardiovascular: Negative for chest pain and palpitations.  Gastrointestinal: Positive for nausea and vomiting. Negative for abdominal pain, diarrhea, constipation and abdominal distention.  Genitourinary: Negative for dysuria, frequency, flank pain and difficulty urinating.  Musculoskeletal: Negative for myalgias and gait problem. Back pain: chronic.  Skin: Negative for color change and rash.  Allergic/Immunologic: Negative for food allergies and immunocompromised state.  Neurological: Positive for light-headedness and headaches. Negative for dizziness, speech difficulty, weakness and numbness.   Psychiatric/Behavioral: Negative for confusion and agitation. The patient is nervous/anxious.     Allergies  Review of patient's allergies indicates no known allergies.  Home Medications   Current Outpatient Rx  Name  Route  Sig  Dispense  Refill  . ALPRAZolam (XANAX) 0.5 MG tablet   Oral   Take 0.5 mg by mouth 2 (two) times daily.         Marland Kitchen amphetamine-dextroamphetamine (ADDERALL XR) 20 MG 24 hr capsule   Oral   Take 20 mg by mouth daily.         . methadone (DOLOPHINE) 10 MG tablet   Oral   Take 10 mg by mouth every 6 (six) hours as needed for pain.           BP 107/73  Pulse 109  Temp(Src) 97.9 F (36.6 C) (Oral)  Resp 18  Ht 6' (1.829 m)  Wt 175 lb (79.379 kg)  BMI 23.73 kg/m2  SpO2 98%  Physical Exam  Nursing note and vitals reviewed. Constitutional: He is oriented to person, place, and time. He appears well-developed and well-nourished.  HENT:  Head: Normocephalic and atraumatic.  Right Ear: Tympanic membrane normal.  Left Ear: Tympanic membrane normal.  Nose: Nose normal.  Mouth/Throat: Uvula is midline and mucous membranes are normal. Posterior oropharyngeal erythema (beef red) present.  Eyes: EOM are normal. Pupils are equal, round, and reactive to light.  Neck: Neck supple.  Cardiovascular: Tachycardia present.   Pulmonary/Chest: Effort normal and breath sounds normal. No respiratory distress.  Abdominal: Soft. Bowel sounds are normal. There is tenderness in the epigastric area. There is no rigidity, no rebound, no guarding and no CVA tenderness.  Musculoskeletal: Normal range of motion. He  exhibits no edema.  Neurological: He is alert and oriented to person, place, and time. No cranial nerve deficit.  Skin: Skin is warm and dry.  Psychiatric: He has a normal mood and affect. His behavior is normal. Judgment and thought content normal.   Procedures   Results for orders placed during the hospital encounter of 07/29/12 (from the past 24 hour(s))   RAPID STREP SCREEN     Status: Abnormal   Collection Time    07/29/12  7:15 PM      Result Value Range   Streptococcus, Group A Screen (Direct) POSITIVE (*) NEGATIVE  CBC WITH DIFFERENTIAL     Status: Abnormal   Collection Time    07/29/12  8:03 PM      Result Value Range   WBC 15.0 (*) 4.0 - 10.5 K/uL   RBC 4.53  4.22 - 5.81 MIL/uL   Hemoglobin 13.9  13.0 - 17.0 g/dL   HCT 45.4  09.8 - 11.9 %   MCV 88.5  78.0 - 100.0 fL   MCH 30.7  26.0 - 34.0 pg   MCHC 34.7  30.0 - 36.0 g/dL   RDW 14.7  82.9 - 56.2 %   Platelets 228  150 - 400 K/uL   Neutrophils Relative 91 (*) 43 - 77 %   Neutro Abs 13.7 (*) 1.7 - 7.7 K/uL   Lymphocytes Relative 4 (*) 12 - 46 %   Lymphs Abs 0.6 (*) 0.7 - 4.0 K/uL   Monocytes Relative 5  3 - 12 %   Monocytes Absolute 0.7  0.1 - 1.0 K/uL   Eosinophils Relative 0  0 - 5 %   Eosinophils Absolute 0.0  0.0 - 0.7 K/uL   Basophils Relative 0  0 - 1 %   Basophils Absolute 0.0  0.0 - 0.1 K/uL  COMPREHENSIVE METABOLIC PANEL     Status: Abnormal   Collection Time    07/29/12  8:03 PM      Result Value Range   Sodium 139  135 - 145 mEq/L   Potassium 3.6  3.5 - 5.1 mEq/L   Chloride 97  96 - 112 mEq/L   CO2 28  19 - 32 mEq/L   Glucose, Bld 144 (*) 70 - 99 mg/dL   BUN 12  6 - 23 mg/dL   Creatinine, Ser 1.30  0.50 - 1.35 mg/dL   Calcium 86.5  8.4 - 78.4 mg/dL   Total Protein 8.7 (*) 6.0 - 8.3 g/dL   Albumin 4.0  3.5 - 5.2 g/dL   AST 56 (*) 0 - 37 U/L   ALT 88 (*) 0 - 53 U/L   Alkaline Phosphatase 127 (*) 39 - 117 U/L   Total Bilirubin 0.3  0.3 - 1.2 mg/dL   GFR calc non Af Amer >90  >90 mL/min   GFR calc Af Amer >90  >90 mL/min    Assessment: 42 y.o. male with sore throat and vomiting   Strep pharyngitis   Epigastric pain   Slightly elevated liver enzymes  Plan:  IV hydration, Zofran, Pepcid   Rocephin 1 gram IV    Re evaluation: Patient continued to have nausea, Phenergan 12.5 mg IV given  21:30 Reevaluation: patient is feeling better, no nausea or  vomiting at this time.   Will treat strep and nausea, discussed with the patient need for follow up with PCP re: elevated LFT's. Patient voices understanding.   I have reviewed this patient's vital signs, nurses notes, appropriate labs  and discussed finding with the patient and plan of care. Patient voices understanding.    Medication List    TAKE these medications       amoxicillin 250 MG capsule  Commonly known as:  AMOXIL  Take 1 capsule (250 mg total) by mouth 3 (three) times daily.     promethazine 12.5 MG tablet  Commonly known as:  PHENERGAN  Take 1 tablet (12.5 mg total) by mouth every 6 (six) hours as needed for nausea.     ranitidine 150 MG tablet  Commonly known as:  ZANTAC  Take 1 tablet (150 mg total) by mouth 2 (two) times daily.      ASK your doctor about these medications       ALPRAZolam 0.5 MG tablet  Commonly known as:  XANAX  Take 0.5 mg by mouth 2 (two) times daily.     amphetamine-dextroamphetamine 20 MG 24 hr capsule  Commonly known as:  ADDERALL XR  Take 20 mg by mouth daily.     methadone 10 MG tablet  Commonly known as:  DOLOPHINE  Take 10 mg by mouth every 6 (six) hours as needed for pain.            [place], [place] 07/29/12 2142

## 2012-07-29 NOTE — ED Provider Notes (Signed)
Medical screening examination/treatment/procedure(s) were performed by non-physician practitioner and as supervising physician I was immediately available for consultation/collaboration.   Carolyne Whitsel Y. Iasiah Ozment, MD 07/29/12 2315 

## 2012-07-29 NOTE — ED Notes (Signed)
Patient complaining of sore throat, fever, and vomiting since yesterday. 

## 2012-10-29 ENCOUNTER — Emergency Department (HOSPITAL_COMMUNITY)
Admission: EM | Admit: 2012-10-29 | Discharge: 2012-10-29 | Disposition: A | Discharge status: Home / Self Care | Payer: Medicaid Other | Source: Home / Self Care | Attending: Emergency Medicine | Admitting: Emergency Medicine

## 2012-10-29 ENCOUNTER — Emergency Department (INDEPENDENT_AMBULATORY_CARE_PROVIDER_SITE_OTHER): Payer: Medicaid Other

## 2012-10-29 ENCOUNTER — Encounter (HOSPITAL_COMMUNITY): Payer: Self-pay | Admitting: Emergency Medicine

## 2012-10-29 DIAGNOSIS — S9031XA Contusion of right foot, initial encounter: Secondary | ICD-10-CM

## 2012-10-29 DIAGNOSIS — S9030XA Contusion of unspecified foot, initial encounter: Secondary | ICD-10-CM

## 2012-10-29 MED ORDER — IBUPROFEN 800 MG PO TABS: 800.0000 mg | ORAL_TABLET | Freq: Three times a day (TID) | ORAL | Status: DC

## 2012-10-29 NOTE — ED Provider Notes (Signed)
Chief Complaint:   Chief Complaint  Patient presents with  . Foot Injury    History of Present Illness:   Alexander Liu is a 42 year old male who dropped a heavy piece of furniture on his foot at home a week ago. Ever since then he has a bruise over the dorsum of the foot in the plantar aspect as well. He's able to move the ankle well and we'll toes without a numbness or tingling, but he does have some pain with weightbearing and ambulation.  Review of Systems:  Other than noted above, the patient denies any of the following symptoms: Systemic:  No fevers, chills, sweats, or aches.  No fatigue or tiredness. Musculoskeletal:  No joint pain, arthritis, bursitis, swelling, back pain, or neck pain. Neurological:  No muscular weakness, paresthesias, headache, or trouble with speech or coordination.  No dizziness.  PMFSH:  Past medical history, family history, social history, meds, and allergies were reviewed.  He takes methadone and Xanax for chronic lower back pain.  Physical Exam:   Vital signs:  BP 96/74  Pulse 93  Temp(Src) 98.3 F (36.8 C) (Oral)  Resp 16  SpO2 97% Gen:  Alert and oriented times 3.  In no distress. Musculoskeletal: Exam of the foot reveals bruising over the dorsum of the foot and the plantar aspects as well. There is tenderness to palpation of the middle 3 metatarsals. No obvious deformity. Full range of motion of ankles and MTP joints without any pain. Sensation is intact and pulses are full.  Otherwise, all joints had a full a ROM with no swelling, bruising or deformity.  No edema, pulses full. Extremities were warm and pink.  Capillary refill was brisk.  Skin:  Clear, warm and dry.  No rash. Neuro:  Alert and oriented times 3.  Muscle strength was normal.  Sensation was intact to light touch.   Radiology:  Dg Foot Complete Right  10/29/2012   *RADIOLOGY REPORT*  Clinical Data: Traumatic injury 1 week previous  RIGHT FOOT COMPLETE - 3+ VIEW  Comparison: None.  Findings:  No acute fracture or dislocation is noted.  No gross soft tissue abnormality is seen.  Small accessory ossicles are noted.  IMPRESSION: No acute abnormality noted.   Original Report Authenticated By: Alcide Clever, M.D.   I reviewed the images independently and personally and concur with the radiologist's findings.  Course in Urgent Care Center:   Given a postoperative boot and ibuprofen 800 mg by mouth for pain.  Assessment:  The encounter diagnosis was Contusion of foot, right, initial encounter.  Appears to be a soft tissue injury. No evidence for fracture.  Plan:   1.  The following meds were prescribed:   Discharge Medication List as of 10/29/2012 12:05 PM    START taking these medications   Details  ibuprofen (ADVIL,MOTRIN) 800 MG tablet Take 1 tablet (800 mg total) by mouth 3 (three) times daily., Starting 10/29/2012, Until Discontinued, Normal       2.  The patient was instructed in symptomatic care, including rest and activity, elevation, application of ice and compression.  Appropriate handouts were given. 3.  The patient was told to return if becoming worse in any way, if no better in 3 or 4 days, and given some red flag symptoms such as worsening pain or neurological symptoms that would indicate earlier return.   4.  The patient was told to follow up here in 2 weeks if no improvement.    Reuben Likes,  MD 10/29/12 2018

## 2012-10-29 NOTE — ED Notes (Signed)
Pt  Reports   He  Dropped  A    Heavy    Object       On  His  r  Foot    He has  Pain  On  Weight  Bearing        With  Ecchymosis            Injury  Occurred   6  Days  Ago

## 2015-03-05 IMAGING — CR DG FOOT COMPLETE 3+V*R*
4 series · 4 of 4 positions shown · non-contrast
Comparison: None.

CLINICAL DATA: Traumatic injury 1 week previous

RIGHT FOOT COMPLETE - 3+ VIEW

[view not recorded (1 of 4)]
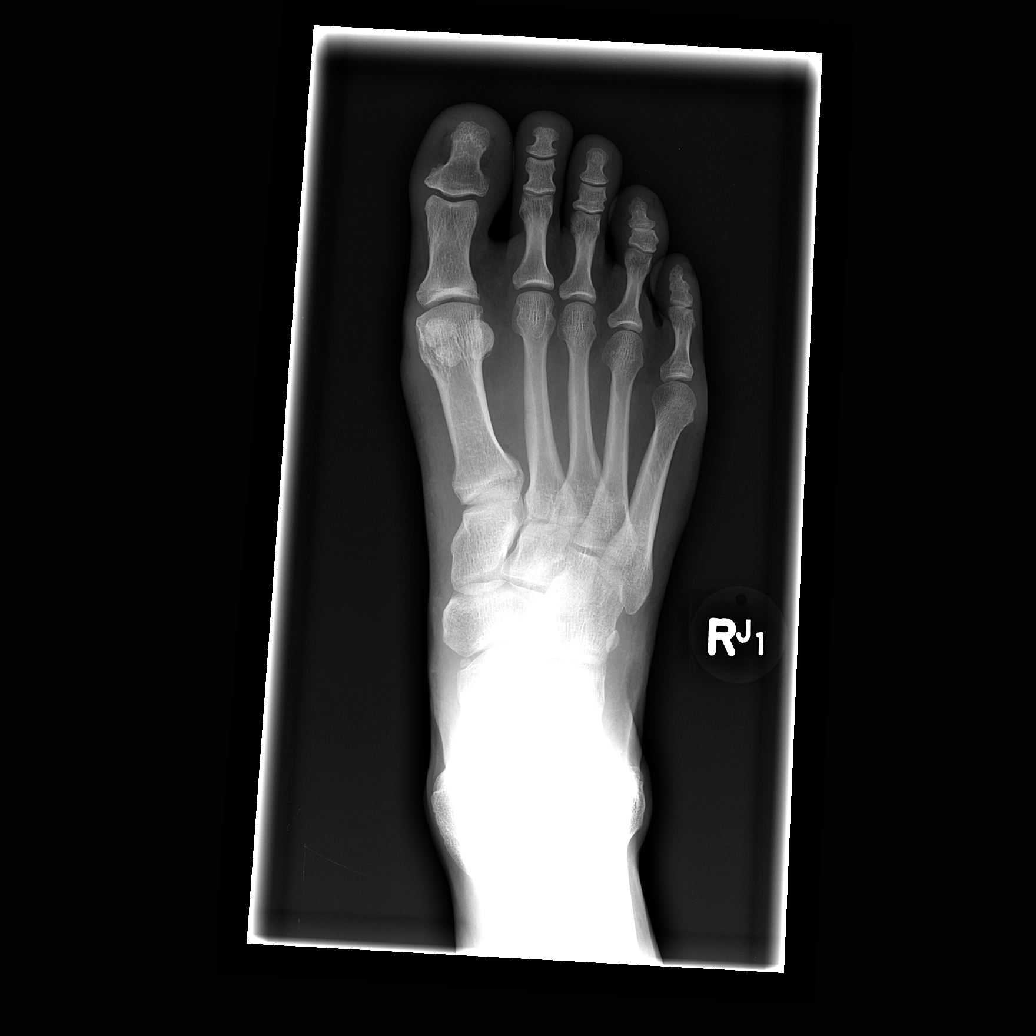

[view not recorded (2 of 4)]
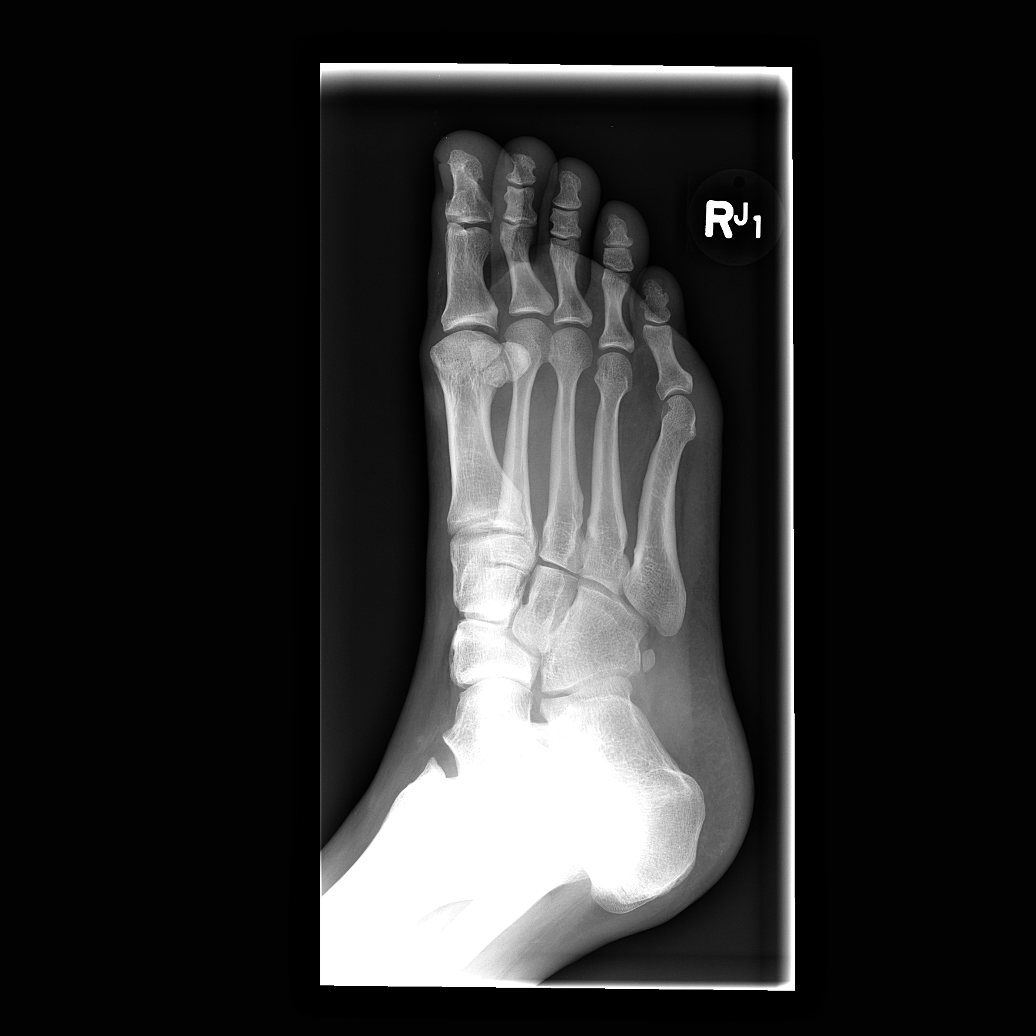

[view not recorded (3 of 4)]
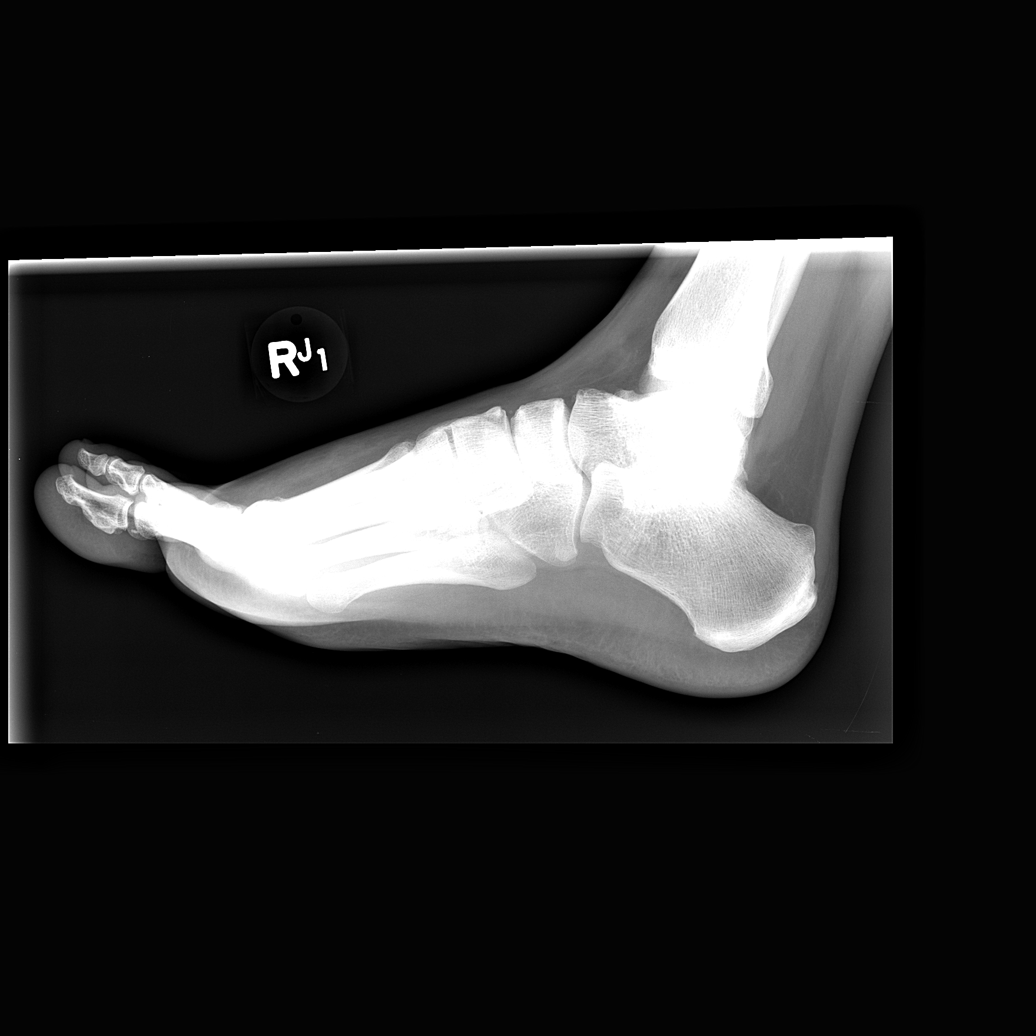

[view not recorded (4 of 4)]
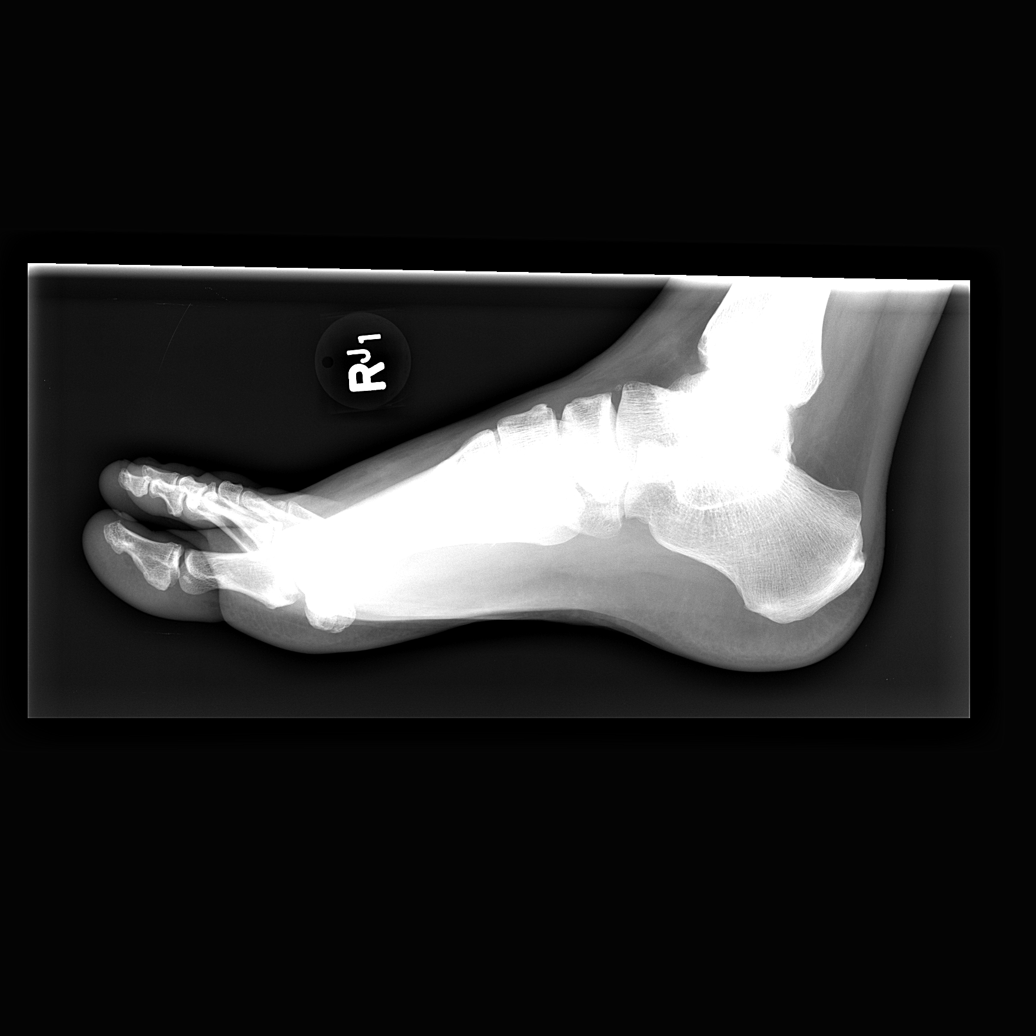

[4 of 4 positions shown; findings below may reference images not displayed]

FINDINGS: No acute fracture or dislocation is noted.  No gross soft
tissue abnormality is seen.  Small accessory ossicles are noted.
IMPRESSION: No acute abnormality noted.

## 2016-07-23 ENCOUNTER — Emergency Department (HOSPITAL_COMMUNITY)
Admission: EM | Admit: 2016-07-23 | Discharge: 2016-07-24 | Disposition: A | Discharge status: Home / Self Care | Payer: Medicaid Other | Attending: Emergency Medicine | Admitting: Emergency Medicine

## 2016-07-23 ENCOUNTER — Encounter (HOSPITAL_COMMUNITY): Payer: Self-pay

## 2016-07-23 DIAGNOSIS — K529 Noninfective gastroenteritis and colitis, unspecified: Secondary | ICD-10-CM | POA: Diagnosis not present

## 2016-07-23 DIAGNOSIS — Z87891 Personal history of nicotine dependence: Secondary | ICD-10-CM | POA: Diagnosis not present

## 2016-07-23 DIAGNOSIS — Z79899 Other long term (current) drug therapy: Secondary | ICD-10-CM | POA: Diagnosis not present

## 2016-07-23 DIAGNOSIS — Z7982 Long term (current) use of aspirin: Secondary | ICD-10-CM | POA: Diagnosis not present

## 2016-07-23 DIAGNOSIS — J111 Influenza due to unidentified influenza virus with other respiratory manifestations: Secondary | ICD-10-CM

## 2016-07-23 DIAGNOSIS — R509 Fever, unspecified: Secondary | ICD-10-CM | POA: Diagnosis present

## 2016-07-23 MED ORDER — PROCHLORPERAZINE EDISYLATE 5 MG/ML IJ SOLN
INTRAMUSCULAR | Status: AC
  Filled 2016-07-23: qty 2

## 2016-07-23 MED ORDER — KETOROLAC TROMETHAMINE 30 MG/ML IJ SOLN
30.0000 mg | Freq: Once | INTRAMUSCULAR | Status: AC
  Administered 2016-07-23: 30 mg via INTRAVENOUS
  Filled 2016-07-23: qty 1

## 2016-07-23 MED ORDER — PROCHLORPERAZINE EDISYLATE 5 MG/ML IJ SOLN
10.0000 mg | Freq: Once | INTRAMUSCULAR | Status: AC
  Administered 2016-07-23: 10 mg via INTRAVENOUS
  Filled 2016-07-23: qty 2

## 2016-07-23 MED ORDER — SODIUM CHLORIDE 0.9 % IV SOLN
1000.0000 mL | Freq: Once | INTRAVENOUS | Status: AC
  Administered 2016-07-23: 1000 mL via INTRAVENOUS

## 2016-07-23 MED ORDER — PROMETHAZINE HCL 25 MG/ML IJ SOLN: 25.0000 mg | Freq: Once | INTRAMUSCULAR | Status: DC

## 2016-07-23 MED ORDER — ACETAMINOPHEN 650 MG RE SUPP: 650.0000 mg | Freq: Once | RECTAL | Status: DC

## 2016-07-23 MED ORDER — SODIUM CHLORIDE 0.9 % IV SOLN
1000.0000 mL | INTRAVENOUS | Status: DC
  Administered 2016-07-23: 1000 mL via INTRAVENOUS

## 2016-07-23 NOTE — ED Provider Notes (Signed)
[facility]-EMERGENCY DEPT Provider Note   CSN: [id number] Arrival date & time: 07/23/16  1945     History   Chief Complaint Chief Complaint  Patient presents with  . Influenza    HPI Alexander Liu is a 46 y.o. male.  The patient is a 46 year old male who presents to the emergency department with a complaint of flu symptoms.  The patient states that he's been having fever and chills on recently. His been having diarrhea as well as vomiting. The diarrhea mostly started today, but the vomiting has been going on since yesterday. The patient has not noticed any blood in the stool or in the vomitus. He has had chills. He states his temperature is been as high as 101. He has been around other people who have been ill. He presents now for assistance because he states that he is having problems to stop vomiting.      History reviewed. No pertinent past medical history.  Patient Active Problem List   Diagnosis Date Noted  . COMB OPIOID RX W/ANY OTH RX DEPEND UNSPEC ABS 08/11/2007  . GERD 08/11/2007  . SEIZURE DISORDER 08/11/2007    Past Surgical History:  Procedure Laterality Date  . BACK SURGERY         Home Medications    Prior to Admission medications   Medication Sig Start Date End Date Taking? Authorizing Provider  ALPRAZolam Prudy Feeler(XANAX) 0.5 MG tablet Take 0.5 mg by mouth 2 (two) times daily.    Historical Provider, MD  amoxicillin (AMOXIL) 250 MG capsule Take 1 capsule (250 mg total) by mouth 3 (three) times daily. 07/29/12   Hope Orlene OchM Neese, NP  amphetamine-dextroamphetamine (ADDERALL XR) 20 MG 24 hr capsule Take 20 mg by mouth daily.    Historical Provider, MD  ibuprofen (ADVIL,MOTRIN) 800 MG tablet Take 1 tablet (800 mg total) by mouth 3 (three) times daily. 10/29/12   Reuben Likesavid C Keller, MD  methadone (DOLOPHINE) 10 MG tablet Take 10 mg by mouth every 6 (six) hours as needed for pain.    Historical Provider, MD  promethazine (PHENERGAN) 12.5 MG tablet Take 1 tablet (12.5 mg total) by  mouth every 6 (six) hours as needed for nausea. 07/29/12   Hope Orlene OchM Neese, NP  ranitidine (ZANTAC) 150 MG tablet Take 1 tablet (150 mg total) by mouth 2 (two) times daily. 07/29/12   Hope Orlene OchM Neese, NP    Family History No family history on file.  Social History Social History  Substance Use Topics  . Smoking status: Former Games developermoker  . Smokeless tobacco: Never Used  . Alcohol use No     Allergies   Patient has no known allergies.   Review of Systems Review of Systems  Constitutional: Negative for activity change.       All ROS Neg except as noted in HPI  HENT: Positive for congestion. Negative for nosebleeds.   Eyes: Negative for photophobia and discharge.  Respiratory: Positive for cough. Negative for shortness of breath and wheezing.   Cardiovascular: Negative for chest pain and palpitations.  Gastrointestinal: Positive for diarrhea, nausea and vomiting. Negative for abdominal pain and blood in stool.  Genitourinary: Negative for dysuria, frequency and hematuria.  Musculoskeletal: Negative for arthralgias, back pain and neck pain.  Skin: Negative.   Neurological: Negative for dizziness, seizures and speech difficulty.  Psychiatric/Behavioral: Negative for confusion and hallucinations.     Physical Exam Updated Vital Signs BP 130/92 (BP Location: Left Arm)   Pulse 97   Temp 99.9  F (37.7 C) (Oral)   Resp 20   Ht 5\' 11"  (1.803 m)   Wt 78.9 kg   SpO2 98%   BMI 24.27 kg/m   Physical Exam  Constitutional: He is oriented to person, place, and time. He appears well-developed and well-nourished.  Non-toxic appearance.  Active vomiting upon my arrival to the room.  HENT:  Head: Normocephalic.  Right Ear: Tympanic membrane and external ear normal.  Left Ear: Tympanic membrane and external ear normal.  Eyes: EOM and lids are normal. Pupils are equal, round, and reactive to light.  Neck: Normal range of motion. Neck supple. Carotid bruit is not present.  Cardiovascular:  Normal rate, regular rhythm, normal heart sounds, intact distal pulses and normal pulses.   Pulmonary/Chest: Breath sounds normal. No respiratory distress.  Abdominal: Soft. Bowel sounds are normal. There is tenderness. There is no guarding.  Mild-to-moderate abdominal wall soreness. No hepatomegaly, no splenomegaly appreciated. No mass appreciated. No CVA tenderness noted.  Musculoskeletal: Normal range of motion.  Lymphadenopathy:       Head (right side): No submandibular adenopathy present.       Head (left side): No submandibular adenopathy present.    He has no cervical adenopathy.  Neurological: He is alert and oriented to person, place, and time. He has normal strength. No cranial nerve deficit or sensory deficit.  Skin: Skin is warm and dry. There is pallor.  Psychiatric: He has a normal mood and affect. His speech is normal.  Nursing note and vitals reviewed.    ED Treatments / Results  Labs (all labs ordered are listed, but only abnormal results are displayed) Labs Reviewed - No data to display  EKG  EKG Interpretation None       Radiology No results found.  Procedures Procedures (including critical care time)  Medications Ordered in ED Medications - No data to display   Initial Impression / Assessment and Plan / ED Course  I have reviewed the triage vital signs and the nursing notes.  Pertinent labs & imaging results that were available during my care of the patient were reviewed by me and considered in my medical decision making (see chart for details).     **I have reviewed nursing notes, vital signs, and all appropriate lab and imaging results for this patient.*  Final Clinical Impressions(s) / ED Diagnoses  MDM Patient reports fever, chills, diarrhea, and repeated vomiting. He's having active vomiting here in the emergency department upon my arrival to the room.  The patient was treated in the emergency department with IV fluids and IV Compazine. On  no vomiting after receiving the fluids and Compazine.  I suspect the patient has influenza and nausea/vomiting. The patient will be treated with promethazine every 6 hours as needed. I've asked patient to use clear liquids for the next 24 hours and then gradually advance his diet. Patient is to see his Medicaid access physician or return to the verge department if any changes or problems.  Plan discharging the patient, the nurse reports the patient has small amount of vomitus. Patient was given promethazine 25 mg. He is advised to return to the emergent department if this situation is not improving.    Final diagnoses:  Influenza  Gastroenteritis    New Prescriptions New Prescriptions   No medications on file     Ivery Quale, PA-C 07/24/16 0112    Mancel Bale, MD 07/24/16 1250

## 2016-07-23 NOTE — ED Triage Notes (Signed)
Patient complaining of nausea and vomiting, having loose stools also per pt.  Fever and chills are also present.  Symptoms started this morning per pt.

## 2016-07-24 MED ORDER — PROMETHAZINE HCL 25 MG PO TABS: 25.0000 mg | ORAL_TABLET | Freq: Four times a day (QID) | ORAL | 0 refills | Status: AC | PRN

## 2016-07-24 MED ORDER — PROMETHAZINE HCL 12.5 MG PO TABS
25.0000 mg | ORAL_TABLET | Freq: Once | ORAL | Status: DC
  Filled 2016-07-24: qty 2

## 2016-07-24 MED ORDER — PROCHLORPERAZINE EDISYLATE 5 MG/ML IJ SOLN
10.0000 mg | Freq: Once | INTRAMUSCULAR | Status: AC
  Administered 2016-07-24: 10 mg via INTRAVENOUS
  Filled 2016-07-24: qty 2

## 2016-07-24 NOTE — Discharge Instructions (Signed)
Your examination suggest influenza and nausea/vomiting. Please wash hands frequently. Please keep your distance from others. Please use promethazine every 6 hours as needed for nausea/vomiting. Please increase fluids. Please use clear liquids over the next 24 hours, and gradually increase your diet to solid foods. Promethazine may cause drowsiness, please use this medication with caution. Use Tylenol every 4 hours or ibuprofen every 6 hours for fever or aching.
